# Patient Record
Sex: Female | Born: 1940 | Race: Black or African American | Hispanic: No | Marital: Single | Smoking: Never smoker
Health system: Southern US, Community
[De-identification: ages and names within clinical notes are randomized; demographics above are authoritative.]

## PROBLEM LIST (undated history)

## (undated) DIAGNOSIS — J449 Chronic obstructive pulmonary disease, unspecified: Secondary | ICD-10-CM

---

## 2016-12-19 ENCOUNTER — Encounter (HOSPITAL_COMMUNITY): Payer: Self-pay | Admitting: Emergency Medicine

## 2016-12-19 ENCOUNTER — Emergency Department
Admission: EM | Admit: 2016-12-19 | Discharge: 2016-12-19 | Discharge status: Against Medical Advice | Payer: Self-pay | Attending: Emergency Medicine | Admitting: Emergency Medicine

## 2016-12-19 ENCOUNTER — Ambulatory Visit (HOSPITAL_COMMUNITY)
Admission: EM | Admit: 2016-12-19 | Discharge: 2016-12-19 | Disposition: A | Discharge status: Home / Self Care | Payer: Medicare (Managed Care) | Attending: Internal Medicine | Admitting: Internal Medicine

## 2016-12-19 DIAGNOSIS — S46002A Unspecified injury of muscle(s) and tendon(s) of the rotator cuff of left shoulder, initial encounter: Secondary | ICD-10-CM | POA: Diagnosis not present

## 2016-12-19 DIAGNOSIS — M542 Cervicalgia: Secondary | ICD-10-CM

## 2016-12-19 DIAGNOSIS — M545 Low back pain: Secondary | ICD-10-CM | POA: Insufficient documentation

## 2016-12-19 DIAGNOSIS — W19XXXA Unspecified fall, initial encounter: Secondary | ICD-10-CM

## 2016-12-19 DIAGNOSIS — Z5321 Procedure and treatment not carried out due to patient leaving prior to being seen by health care provider: Secondary | ICD-10-CM | POA: Insufficient documentation

## 2016-12-19 HISTORY — DX: Chronic obstructive pulmonary disease, unspecified: J44.9

## 2016-12-19 NOTE — ED Provider Notes (Signed)
[facility]    CSN: [id number] Arrival date & time: 12/19/16  1704     History   Chief Complaint Chief Complaint  Patient presents with  . Fall    HPI Sheri Walsh is a 76 y.o. female. She tripped over a 'truck' in [employer] 9 or 10 days ago, fell forward, and has had some difficulty with discomfort and decreased range of motion in the left shoulder since. Some stiffness and discomfort in the neck with left neck rotation, but this is intermittent. Some stiffness and discomfort intermittently in the right hip and down the right leg, not severe. Small abrasion on the left lower shin, healed. Was able to walk into the urgent care independently. Has been caring for her son, who is recovering from cardiac arrest. Neither drives, and she had some difficulty coming in for evaluation.    HPI  Past Medical History:  Diagnosis Date  . COPD (chronic obstructive pulmonary disease) (HCC)     History reviewed. No pertinent surgical history.   Home Medications    Prior to Admission medications   Medication Sig Start Date End Date Taking? Authorizing Provider  montelukast (SINGULAIR) 10 MG tablet Take 10 mg by mouth at bedtime.   Yes [provider]    Family History History reviewed. No pertinent family history.  Social History Social History  Substance Use Topics  . Smoking status: Never Smoker  . Smokeless tobacco: Never Used  . Alcohol use No     Allergies   Patient has no known allergies.   Review of Systems Review of Systems  All other systems reviewed and are negative.    Physical Exam Triage Vital Signs ED Triage Vitals  Enc Vitals Group     BP 12/19/16 1825 119/62     Pulse Rate 12/19/16 1825 61     Resp 12/19/16 1825 18     Temp 12/19/16 1825 98.9 F (37.2 C)     Temp Source 12/19/16 1825 Oral     SpO2 12/19/16 1825 99 %     Weight --      Height --      Pain Score 12/19/16 1822 0     Pain Loc --    Updated Vital Signs BP  119/62 (BP Location: Right Arm)   Pulse 61   Temp 98.9 F (37.2 C) (Oral)   Resp 18   SpO2 99%   Physical Exam  Constitutional: She is oriented to person, place, and time. No distress.  HENT:  Head: Atraumatic.  Eyes:  Conjugate gaze observed, no eye redness/discharge  Neck: Neck supple.  Cardiovascular: Normal rate.   Pulmonary/Chest: No respiratory distress.  Abdominal: She exhibits no distension.  Musculoskeletal: Normal range of motion.  Not able to fully extend the left arm at the shoulder overhead, painful to externally rotate, but able to do so when the arm is raised passively overhead. He is able to internally rotate. Neck rotation to the left is slightly limited, to the right is full, extension/flexion appear normal. No focal tenderness. Able to rise from a chair without assistance, slowly. Gait is steady, no staggering, no foot drop. Last fall was a couple of years ago.  Neurological: She is alert and oriented to person, place, and time.  Face is symmetric, speech is clear/coherent  Skin: Skin is warm and dry.  Nursing note and vitals reviewed.    UC Treatments / Results   Procedures Procedures (including critical care time) None today  Final Clinical  Impressions(s) / UC Diagnoses   Final diagnoses:  Injury of left rotator cuff, initial encounter  Acute neck pain  Fall, initial encounter   No danger signs on exam today.  Stiffness/soreness in neck and elsewhere should gradually improve.  Ice for 5-10 minutes several times daily may help reduce pain/stiffness.  Aleve or motrin otc may also be helpful for managing pain/stiffness.   Difficulty raising left arm overhead suggests a rotator cuff injury.  Pain will gradually improve.  Followup with an orthopedist in the next few weeks if you are interested in discussing further evaluation or surgery for this.  Recheck as needed.   Eustace Moore, MD 12/23/16 1057

## 2016-12-19 NOTE — ED Notes (Signed)
PT reports that she fell 10 days ago and is having pain to lower back. Pt is ambulatory.

## 2016-12-19 NOTE — Discharge Instructions (Addendum)
No danger signs on exam today.  Stiffness/soreness in neck and elsewhere should gradually improve.  Ice for 5-10 minutes several times daily may help reduce pain/stiffness.  Aleve or motrin otc may also be helpful for managing pain/stiffness.   Difficulty raising left arm overhead suggests a rotator cuff injury.  Pain will gradually improve.  Followup with an orthopedist in the next few weeks if you are interested in discussing further evaluation or surgery for this.  Recheck as needed.

## 2016-12-19 NOTE — ED Notes (Signed)
Pt reports that she has waited 10 days and that the wait is too long so she will see her pcp tomorrow.

## 2016-12-19 NOTE — ED Triage Notes (Signed)
[facility] a complaint of left arm and left shoulder and left toe pain and neck pain secondary to a trip and fall 10 days ago.

## 2017-01-06 ENCOUNTER — Emergency Department (HOSPITAL_COMMUNITY): Payer: Medicare (Managed Care)

## 2017-01-06 ENCOUNTER — Encounter (HOSPITAL_COMMUNITY): Payer: Self-pay

## 2017-01-06 ENCOUNTER — Emergency Department (HOSPITAL_COMMUNITY)
Admission: EM | Admit: 2017-01-06 | Discharge: 2017-01-06 | Disposition: A | Discharge status: Home / Self Care | Payer: Medicare (Managed Care) | Attending: Emergency Medicine | Admitting: Emergency Medicine

## 2017-01-06 DIAGNOSIS — M545 Low back pain, unspecified: Secondary | ICD-10-CM

## 2017-01-06 DIAGNOSIS — M25512 Pain in left shoulder: Secondary | ICD-10-CM | POA: Diagnosis not present

## 2017-01-06 DIAGNOSIS — J449 Chronic obstructive pulmonary disease, unspecified: Secondary | ICD-10-CM | POA: Diagnosis not present

## 2017-01-06 DIAGNOSIS — M549 Dorsalgia, unspecified: Secondary | ICD-10-CM | POA: Diagnosis present

## 2017-01-06 MED ORDER — ORPHENADRINE CITRATE ER 100 MG PO TB12: 100.0000 mg | ORAL_TABLET | Freq: Two times a day (BID) | ORAL | 0 refills | Status: AC

## 2017-01-06 MED ORDER — METHOCARBAMOL 500 MG PO TABS
500.0000 mg | ORAL_TABLET | Freq: Once | ORAL | Status: AC
  Administered 2017-01-06: 500 mg via ORAL
  Filled 2017-01-06: qty 1

## 2017-01-06 MED ORDER — TRAMADOL HCL 50 MG PO TABS: 50.0000 mg | ORAL_TABLET | Freq: Four times a day (QID) | ORAL | 0 refills | Status: AC | PRN

## 2017-01-06 MED ORDER — NAPROXEN 500 MG PO TABS: 500.0000 mg | ORAL_TABLET | Freq: Two times a day (BID) | ORAL | 0 refills | Status: AC

## 2017-01-06 MED ORDER — IBUPROFEN 400 MG PO TABS
400.0000 mg | ORAL_TABLET | Freq: Once | ORAL | Status: AC
  Administered 2017-01-06: 400 mg via ORAL
  Filled 2017-01-06: qty 1

## 2017-01-06 NOTE — ED Triage Notes (Signed)
Pt here for left foot tingling, back pain, and shoulder pain sts that fell approx 1 month ago and she just wants to be checked out.

## 2017-01-06 NOTE — ED Provider Notes (Signed)
[facility]   CSN: [id number] Arrival date & time: 01/06/17  [id number]     History   Chief Complaint Chief Complaint  Patient presents with  . Back Pain  . Foot Injury    HPI Sheri Walsh is a 76 y.o. female.  The history is provided by the patient.  She has been having ongoing problems with back pain since a fall about one month ago. [place] over something while [place]. She was seen at an urgent care center and diagnosed with rotator cuff injury of the left shoulder. She is advised to apply ice, but has been unable to do so because her refrigerator is not functioning. She has pain and spasms in her back which are worse with any movement. She is unable to rate the pain. She denies weakness, numbness, tingling.  Past Medical History:  Diagnosis Date  . COPD (chronic obstructive pulmonary disease) (HCC)     There are no active problems to display for this patient.   History reviewed. No pertinent surgical history.  OB History    No data available       Home Medications    Prior to Admission medications   Medication Sig Start Date End Date Taking? Authorizing Provider  montelukast (SINGULAIR) 10 MG tablet Take 10 mg by mouth at bedtime.    [provider]    Family History History reviewed. No pertinent family history.  Social History Social History  Substance Use Topics  . Smoking status: Never Smoker  . Smokeless tobacco: Never Used  . Alcohol use No     Allergies   Patient has no known allergies.   Review of Systems Review of Systems  All other systems reviewed and are negative.    Physical Exam Updated Vital Signs BP 136/86 (BP Location: Right Arm)   Pulse 72   Temp 98 F (36.7 C) (Oral)   Resp 20   Ht 5\' 5"  (1.651 m)   Wt 78.5 kg (173 lb)   SpO2 99%   BMI 28.79 kg/m   Physical Exam  Nursing note and vitals reviewed.  76 year old female, resting comfortably and in no acute distress. Vital signs  are normal. Oxygen saturation is 99%, which is normal. Head is normocephalic and atraumatic. PERRLA, EOMI. Oropharynx is clear. Neck is nontender and supple without adenopathy or JVD. Back is nontender and there is no CVA tenderness. Moderate right paralumbar spasm is present. Straight leg raise is negative. Lungs are clear without rales, wheezes, or rhonchi. Chest is nontender. Heart has regular rate and rhythm without murmur. Abdomen is soft, flat, nontender without masses or hepatosplenomegaly and peristalsis is normoactive. Extremities have no cyanosis or edema. Range of motion of the left shoulder is limited-she is unable to raise her arm above her shoulder. There is mild tenderness in the left anterior deltoid groove. Rotator cuff impingement signs are present. Skin is warm and dry without rash. Neurologic: Mental status is normal, cranial nerves are intact, there are no motor or sensory deficits.  ED Treatments / Results   Radiology Dg Lumbar Spine Complete  Result Date: 01/06/2017 CLINICAL DATA:  76 y/o F; status post fall with left shoulder pain and lower back pain. EXAM: LUMBAR SPINE - COMPLETE 4+ VIEW COMPARISON:  None. FINDINGS: No acute fracture identified. Minimal lumbar levocurvature with apex at L4 1 grade 1 L4-5 anterolisthesis. No pars defect. No loss of vertebral body height. Mild loss of the L4-5 and L5 intervertebral disc spaces.  IMPRESSION: 1. No acute fracture or dislocation identified. 2. Minimal lumbar levocurvature and grade 1 L4-5 anterolisthesis. 3. Mild lumbar spondylosis greatest at the L4-5 and L5-S1 levels. Electronically Signed   By: Mitzi HansenLance  Furusawa-Stratton M.D.   On: 01/06/2017 06:49   Dg Shoulder Left  Result Date: 01/06/2017 CLINICAL DATA:  76 y/o F; status post fall with left shoulder pain and lower back pain. EXAM: LEFT SHOULDER - 2+ VIEW COMPARISON:  None. FINDINGS: There is no evidence of fracture or dislocation. There is no evidence of arthropathy or  other focal bone abnormality. Soft tissues are unremarkable. IMPRESSION: Negative. Electronically Signed   By: Mitzi HansenLance  Furusawa-Stratton M.D.   On: 01/06/2017 06:42    Procedures Procedures (including critical care time)  Medications Ordered in ED Medications  methocarbamol (ROBAXIN) tablet 500 mg (500 mg Oral Given 01/06/17 0636)  ibuprofen (ADVIL,MOTRIN) tablet 400 mg (400 mg Oral Given 01/06/17 0636)     Initial Impression / Assessment and Plan / ED Course  I have reviewed the triage vital signs and the nursing notes.  Pertinent labs & imaging results that were available during my care of the patient were reviewed by me and considered in my medical decision making (see chart for details).  Back pain which sounds more like muscle spasm than anything else. Ongoing left shoulder pain which is probably from rotator cuff injury, and probably some element of adhesive capsulitis. Old records are reviewed confirming urgent care visit on July 8, but no imaging was done. Will check x-rays of left shoulder and lumbar spine.  X-rays show no acute injury. She is discharged with prescriptions for naproxen and orphenadrine, referred to orthopedics for follow-up.  Final Clinical Impressions(s) / ED Diagnoses   Final diagnoses:  Left shoulder pain, unspecified chronicity  Low back pain without sciatica, unspecified back pain laterality, unspecified chronicity    New Prescriptions New Prescriptions   NAPROXEN (NAPROSYN) 500 MG TABLET    Take 1 tablet (500 mg total) by mouth 2 (two) times daily.   ORPHENADRINE (NORFLEX) 100 MG TABLET    Take 1 tablet (100 mg total) by mouth 2 (two) times daily.   TRAMADOL (ULTRAM) 50 MG TABLET    Take 1 tablet (50 mg total) by mouth every 6 (six) hours as needed.     Dione BoozeGlick, Wavie Hashimi, MD 01/06/17 [phone]

## 2019-02-21 IMAGING — CR DG LUMBAR SPINE COMPLETE 4+V
5 series · 5 of 5 positions shown · non-contrast
Comparison: None.

CLINICAL DATA: 76 y/o F; status post fall with left shoulder pain
and lower back pain.

EXAM:
LUMBAR SPINE - COMPLETE 4+ VIEW

[l-spine ap]
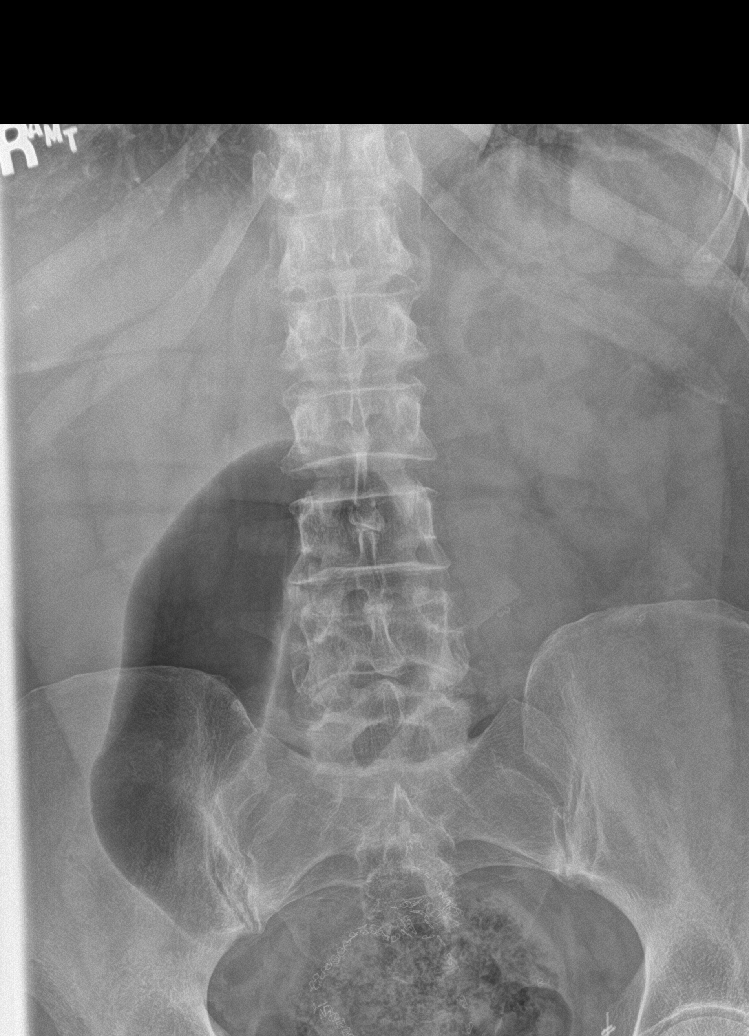

[l-spine obl (1 of 2)]
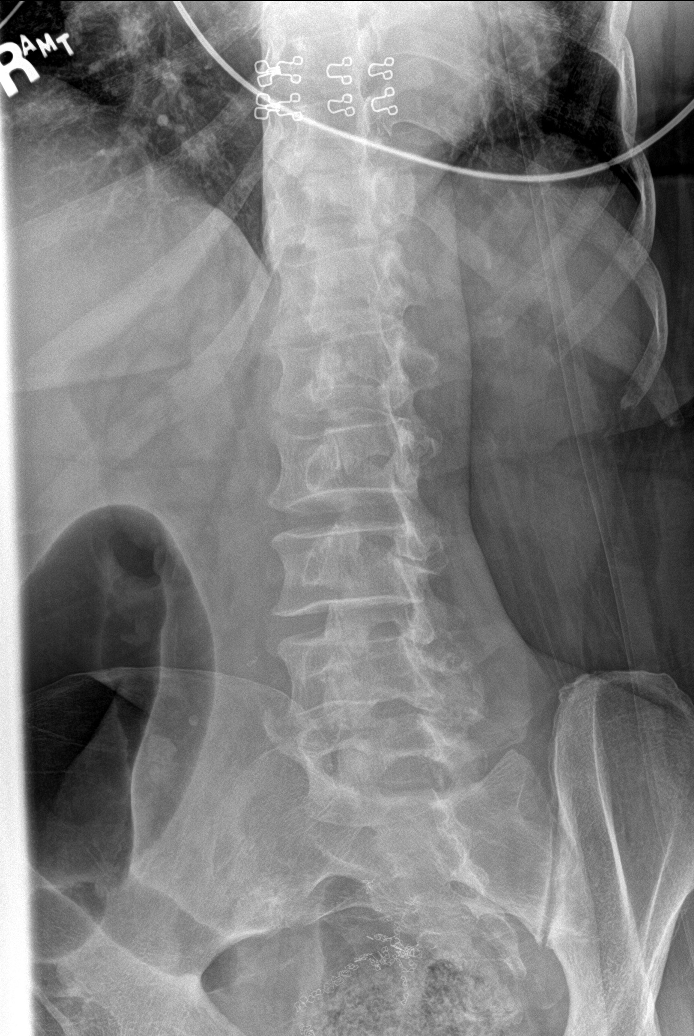

[l-spine obl (2 of 2)]
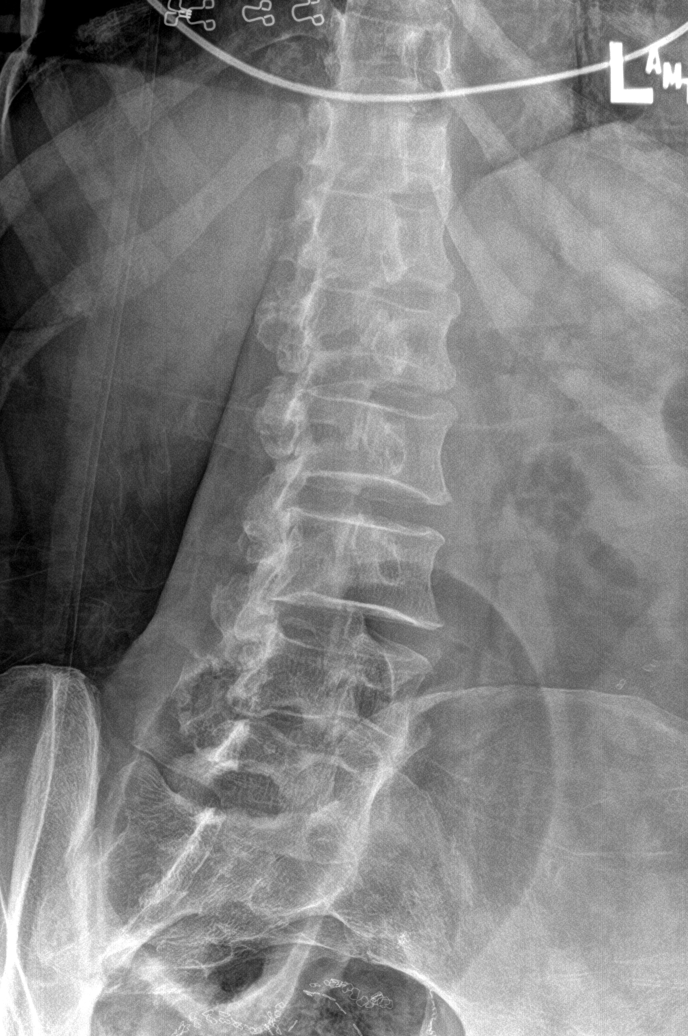

[l-spine lat]
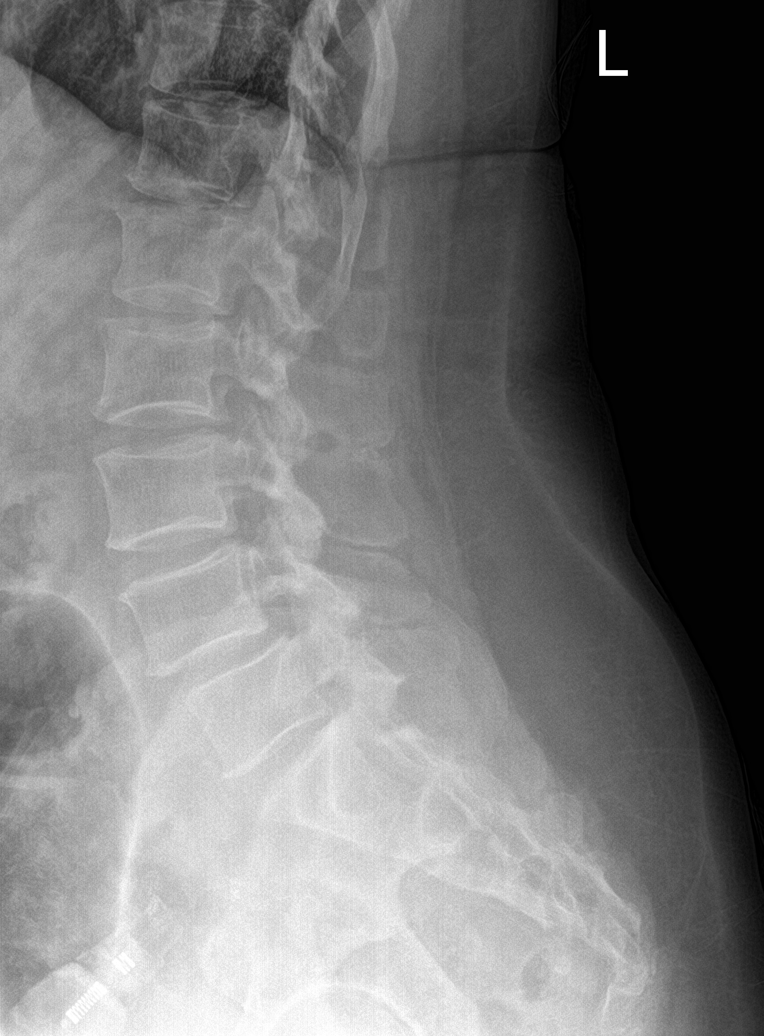

[l-spine spot]
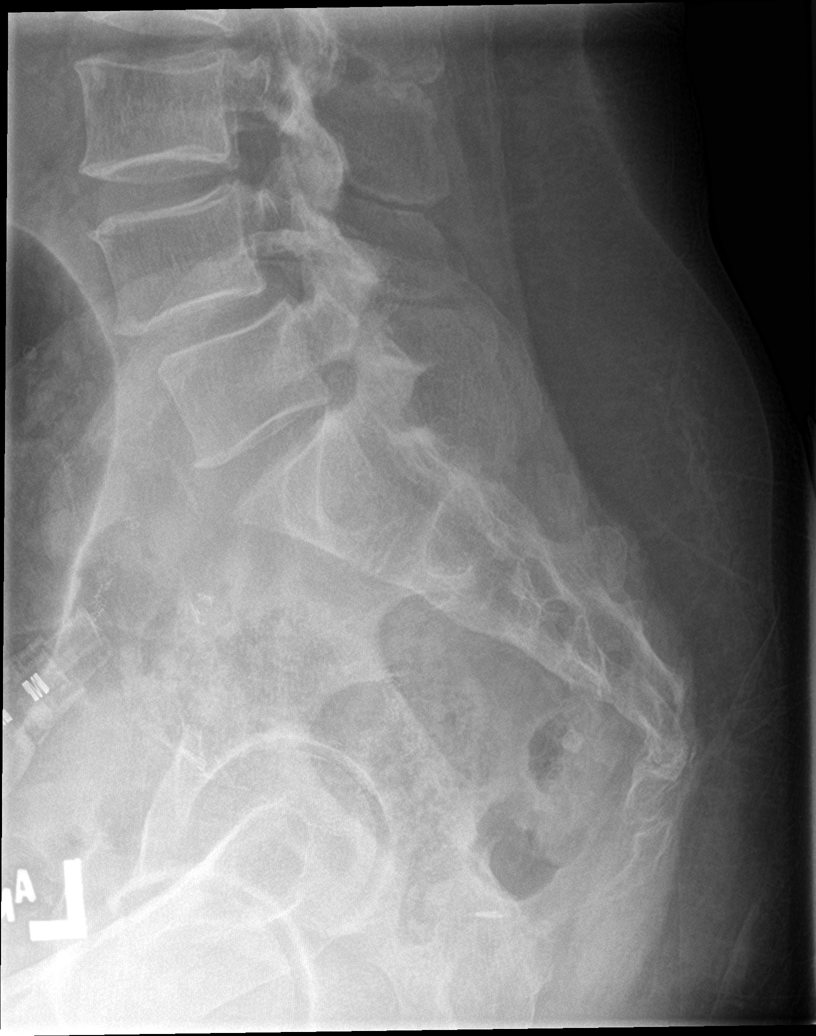

[5 of 5 positions shown; findings below may reference images not displayed]

FINDINGS: No acute fracture identified. Minimal lumbar levocurvature with apex
at L4 1 grade 1 L4-5 anterolisthesis. No pars defect. No loss of
vertebral body height. Mild loss of the L4-5 and L5 intervertebral
disc spaces.
IMPRESSION: 1. No acute fracture or dislocation identified.
2. Minimal lumbar levocurvature and grade 1 L4-5 anterolisthesis.
3. Mild lumbar spondylosis greatest at the L4-5 and L5-S1 levels.

By: Canpo Lider M.D.
